# Patient Record
Sex: Female | Born: 1965 | Race: White | Hispanic: Yes | Marital: Married | Smoking: Current every day smoker
Health system: Southern US, Community
[De-identification: ages and names within clinical notes are randomized; demographics above are authoritative.]

## PROBLEM LIST (undated history)

## (undated) DIAGNOSIS — E119 Type 2 diabetes mellitus without complications: Secondary | ICD-10-CM

## (undated) DIAGNOSIS — F32A Depression, unspecified: Secondary | ICD-10-CM

## (undated) DIAGNOSIS — F329 Major depressive disorder, single episode, unspecified: Secondary | ICD-10-CM

## (undated) HISTORY — PX: BREAST SURGERY: SHX581

---

## 2012-06-26 ENCOUNTER — Encounter (HOSPITAL_BASED_OUTPATIENT_CLINIC_OR_DEPARTMENT_OTHER): Payer: Self-pay

## 2012-06-26 ENCOUNTER — Emergency Department (HOSPITAL_BASED_OUTPATIENT_CLINIC_OR_DEPARTMENT_OTHER)
Admission: EM | Admit: 2012-06-26 | Discharge: 2012-06-27 | Disposition: A | Discharge status: Home / Self Care | Payer: 59 | Attending: Emergency Medicine | Admitting: Emergency Medicine

## 2012-06-26 DIAGNOSIS — F17200 Nicotine dependence, unspecified, uncomplicated: Secondary | ICD-10-CM | POA: Insufficient documentation

## 2012-06-26 DIAGNOSIS — R112 Nausea with vomiting, unspecified: Secondary | ICD-10-CM | POA: Insufficient documentation

## 2012-06-26 DIAGNOSIS — R197 Diarrhea, unspecified: Secondary | ICD-10-CM

## 2012-06-26 DIAGNOSIS — Z8659 Personal history of other mental and behavioral disorders: Secondary | ICD-10-CM | POA: Insufficient documentation

## 2012-06-26 DIAGNOSIS — E119 Type 2 diabetes mellitus without complications: Secondary | ICD-10-CM | POA: Insufficient documentation

## 2012-06-26 DIAGNOSIS — M549 Dorsalgia, unspecified: Secondary | ICD-10-CM | POA: Insufficient documentation

## 2012-06-26 DIAGNOSIS — E278 Other specified disorders of adrenal gland: Secondary | ICD-10-CM | POA: Insufficient documentation

## 2012-06-26 DIAGNOSIS — Z79899 Other long term (current) drug therapy: Secondary | ICD-10-CM | POA: Insufficient documentation

## 2012-06-26 HISTORY — DX: Type 2 diabetes mellitus without complications: E11.9

## 2012-06-26 HISTORY — DX: Major depressive disorder, single episode, unspecified: F32.9

## 2012-06-26 HISTORY — DX: Depression, unspecified: F32.A

## 2012-06-26 LAB — URINALYSIS, ROUTINE W REFLEX MICROSCOPIC
Glucose, UA: >1000 mg/dL — AB
Hgb urine dipstick: NEGATIVE
Ketones, ur: 15 mg/dL — AB
Leukocytes, UA: NEGATIVE
Protein, ur: NEGATIVE mg/dL
pH: 6.5 (ref 5.0–8.0)

## 2012-06-26 LAB — PREGNANCY, URINE: Preg Test, Ur: NEGATIVE

## 2012-06-26 NOTE — ED Notes (Signed)
Patient reports that this past Friday developed nausea, vomiting and diarrhea. Has had persistant nausea since Friday and today she now has diffuse abdominal pain with radiation top her back. Vomiting pta

## 2012-06-27 ENCOUNTER — Emergency Department (HOSPITAL_BASED_OUTPATIENT_CLINIC_OR_DEPARTMENT_OTHER): Payer: 59

## 2012-06-27 LAB — CBC WITH DIFFERENTIAL/PLATELET
HCT: 42 % (ref 36.0–46.0)
Hemoglobin: 14.5 g/dL (ref 12.0–15.0)
Lymphs Abs: 2.1 10*3/uL (ref 0.7–4.0)
MCH: 29.6 pg (ref 26.0–34.0)
Monocytes Absolute: 0.5 10*3/uL (ref 0.1–1.0)
Monocytes Relative: 4 % (ref 3–12)
Neutro Abs: 11.7 10*3/uL — ABNORMAL HIGH (ref 1.7–7.7)
Neutrophils Relative %: 81 % — ABNORMAL HIGH (ref 43–77)
RBC: 4.9 MIL/uL (ref 3.87–5.11)

## 2012-06-27 LAB — COMPREHENSIVE METABOLIC PANEL
Alkaline Phosphatase: 75 U/L (ref 39–117)
BUN: 13 mg/dL (ref 6–23)
Chloride: 95 mEq/L — ABNORMAL LOW (ref 96–112)
Creatinine, Ser: 0.6 mg/dL (ref 0.50–1.10)
GFR calc Af Amer: >90 mL/min (ref >90)
GFR calc non Af Amer: >90 mL/min (ref >90)
Glucose, Bld: 339 mg/dL — ABNORMAL HIGH (ref 70–99)
Potassium: 4 mEq/L (ref 3.5–5.1)
Total Bilirubin: 0.2 mg/dL — ABNORMAL LOW (ref 0.3–1.2)

## 2012-06-27 LAB — LIPASE, BLOOD: Lipase: 24 U/L (ref 11–59)

## 2012-06-27 MED ORDER — IOHEXOL 300 MG/ML  SOLN
100.0000 mL | Freq: Once | INTRAMUSCULAR | Status: AC | PRN
  Administered 2012-06-27: 100 mL via INTRAVENOUS

## 2012-06-27 MED ORDER — FENTANYL CITRATE 0.05 MG/ML IJ SOLN
50.0000 ug | Freq: Once | INTRAMUSCULAR | Status: AC
  Administered 2012-06-27: 50 ug via INTRAVENOUS
  Filled 2012-06-27: qty 2

## 2012-06-27 MED ORDER — TRAMADOL HCL 50 MG PO TABS: 50.0000 mg | ORAL_TABLET | Freq: Four times a day (QID) | ORAL | Status: AC | PRN

## 2012-06-27 MED ORDER — IOHEXOL 300 MG/ML  SOLN
50.0000 mL | Freq: Once | INTRAMUSCULAR | Status: AC | PRN
  Administered 2012-06-27: 50 mL via ORAL

## 2012-06-27 MED ORDER — ONDANSETRON 8 MG PO TBDP: ORAL_TABLET | ORAL | Status: AC

## 2012-06-27 MED ORDER — ONDANSETRON HCL 4 MG/2ML IJ SOLN
4.0000 mg | Freq: Once | INTRAMUSCULAR | Status: AC
  Administered 2012-06-27: 4 mg via INTRAVENOUS
  Filled 2012-06-27: qty 2

## 2012-06-27 MED ORDER — SODIUM CHLORIDE 0.9 % IV BOLUS (SEPSIS): 1000.0000 mL | Freq: Once | INTRAVENOUS | Status: DC

## 2012-06-27 NOTE — ED Notes (Signed)
Patient transported to CT 

## 2012-06-27 NOTE — ED Provider Notes (Signed)
History     CSN: [id number]  Arrival date & time 06/26/12  2322   First MD Initiated Contact with Patient 06/27/12 [phone]      Chief Complaint  Patient presents with  . Abdominal Pain    (Consider location/radiation/quality/duration/timing/severity/associated sxs/prior treatment) Patient is a 47 y.o. female presenting with abdominal pain. The history is provided by the patient.  Abdominal Pain The primary symptoms of the illness include abdominal pain, nausea, vomiting and diarrhea. The primary symptoms of the illness do not include shortness of breath or dysuria. The current episode started more than 2 days ago. The onset of the illness was sudden. The problem has not changed since onset. The abdominal pain began more than 2 days ago. The pain came on suddenly. The abdominal pain has been unchanged since its onset. The abdominal pain is generalized. The abdominal pain radiates to the back. The severity of the abdominal pain is 10/10. The abdominal pain is relieved by nothing. Exacerbated by: none.  The vomiting began more than 2 days ago. Vomiting occurs 2 to 5 times per day. The emesis contains stomach contents.  The diarrhea began 3 to 5 days ago. The diarrhea is watery. The diarrhea occurs 2 to 4 times per day.  The patient states that she believes she is currently not pregnant. The patient has had a change in bowel habit. Symptoms associated with the illness do not include anorexia. Significant associated medical issues do not include PUD.    Past Medical History  Diagnosis Date  . Diabetes mellitus without complication   . Depression     History reviewed. No pertinent past surgical history.  No family history on file.  History  Substance Use Topics  . Smoking status: Current Every Day Smoker -- 0.5 packs/day    Types: Cigarettes  . Smokeless tobacco: Not on file  . Alcohol Use: No    OB History    Grav Para Term Preterm Abortions TAB SAB Ect Mult Living                  Review of Systems  Respiratory: Negative for shortness of breath.   Cardiovascular: Negative for chest pain.  Gastrointestinal: Positive for nausea, vomiting, abdominal pain and diarrhea. Negative for anorexia.  Genitourinary: Negative for dysuria.  All other systems reviewed and are negative.    Allergies  Review of patient's allergies indicates no known allergies.  Home Medications   Current Outpatient Rx  Name  Route  Sig  Dispense  Refill  . GLUCOSE BLOOD VI STRP   Other   1 each by Other route as needed. Use as instructed         . METFORMIN HCL 1000 MG PO TABS   Oral   Take 1,000 mg by mouth 2 (two) times daily with a meal.           BP 154/89  Pulse 97  Temp 98.6 F (37 C) (Oral)  Resp 18  SpO2 99%  Physical Exam  Constitutional: She is oriented to person, place, and time. She appears well-developed and well-nourished. No distress.  HENT:  Head: Normocephalic and atraumatic.  Mouth/Throat: Oropharynx is clear and moist.  Eyes: Conjunctivae normal are normal. Pupils are equal, round, and reactive to light.  Neck: Normal range of motion. Neck supple.  Cardiovascular: Normal rate, regular rhythm and intact distal pulses.   Pulmonary/Chest: Effort normal and breath sounds normal. She has no wheezes. She has no rales.  Abdominal: Soft. Bowel sounds  are decreased. There is no tenderness. There is no rebound and negative Murphy's sign.  Musculoskeletal: Normal range of motion.  Neurological: She is alert and oriented to person, place, and time.  Skin: Skin is warm and dry.    ED Course  Procedures (including critical care time)  Labs Reviewed  URINALYSIS, ROUTINE W REFLEX MICROSCOPIC - Abnormal; Notable for the following:    Glucose, UA >1000 (*)     Ketones, ur 15 (*)     All other components within normal limits  CBC WITH DIFFERENTIAL - Abnormal; Notable for the following:    WBC 14.5 (*)     Neutrophils Relative 81 (*)     Neutro Abs 11.7 (*)       All other components within normal limits  COMPREHENSIVE METABOLIC PANEL - Abnormal; Notable for the following:    Sodium 133 (*)     Chloride 95 (*)     Glucose, Bld 339 (*)     Total Bilirubin 0.2 (*)     All other components within normal limits  URINE MICROSCOPIC-ADD ON - Abnormal; Notable for the following:    Squamous Epithelial / LPF FEW (*)     All other components within normal limits  PREGNANCY, URINE  LIPASE, BLOOD   No results found.   No diagnosis found.    MDM  Will need a non emergent outpatient MRi to evaluate adrenal lesion, please ask your regular doctor to schedule this as an outpatient.  Patient verbalizes understanding and agrees to follow up        Kasyn Rolph Smitty Cords, MD 06/27/12 [phone]

## 2012-06-27 NOTE — ED Notes (Signed)
CBG was 258 mg/dcltr.

## 2013-11-15 ENCOUNTER — Encounter (HOSPITAL_BASED_OUTPATIENT_CLINIC_OR_DEPARTMENT_OTHER): Payer: Self-pay | Admitting: Emergency Medicine

## 2013-11-15 ENCOUNTER — Emergency Department (HOSPITAL_BASED_OUTPATIENT_CLINIC_OR_DEPARTMENT_OTHER)
Admission: EM | Admit: 2013-11-15 | Discharge: 2013-11-15 | Disposition: A | Discharge status: Home / Self Care | Payer: 59 | Attending: Emergency Medicine | Admitting: Emergency Medicine

## 2013-11-15 DIAGNOSIS — R110 Nausea: Secondary | ICD-10-CM | POA: Insufficient documentation

## 2013-11-15 DIAGNOSIS — M545 Low back pain, unspecified: Secondary | ICD-10-CM | POA: Insufficient documentation

## 2013-11-15 DIAGNOSIS — Z3202 Encounter for pregnancy test, result negative: Secondary | ICD-10-CM | POA: Insufficient documentation

## 2013-11-15 DIAGNOSIS — Z79899 Other long term (current) drug therapy: Secondary | ICD-10-CM | POA: Insufficient documentation

## 2013-11-15 DIAGNOSIS — F3289 Other specified depressive episodes: Secondary | ICD-10-CM | POA: Insufficient documentation

## 2013-11-15 DIAGNOSIS — R209 Unspecified disturbances of skin sensation: Secondary | ICD-10-CM | POA: Insufficient documentation

## 2013-11-15 DIAGNOSIS — B3731 Acute candidiasis of vulva and vagina: Secondary | ICD-10-CM | POA: Insufficient documentation

## 2013-11-15 DIAGNOSIS — R109 Unspecified abdominal pain: Secondary | ICD-10-CM | POA: Insufficient documentation

## 2013-11-15 DIAGNOSIS — F329 Major depressive disorder, single episode, unspecified: Secondary | ICD-10-CM | POA: Insufficient documentation

## 2013-11-15 DIAGNOSIS — M79609 Pain in unspecified limb: Secondary | ICD-10-CM | POA: Insufficient documentation

## 2013-11-15 DIAGNOSIS — R197 Diarrhea, unspecified: Secondary | ICD-10-CM | POA: Insufficient documentation

## 2013-11-15 DIAGNOSIS — F17200 Nicotine dependence, unspecified, uncomplicated: Secondary | ICD-10-CM | POA: Insufficient documentation

## 2013-11-15 DIAGNOSIS — B373 Candidiasis of vulva and vagina: Secondary | ICD-10-CM | POA: Insufficient documentation

## 2013-11-15 DIAGNOSIS — E119 Type 2 diabetes mellitus without complications: Secondary | ICD-10-CM | POA: Insufficient documentation

## 2013-11-15 DIAGNOSIS — M549 Dorsalgia, unspecified: Secondary | ICD-10-CM

## 2013-11-15 LAB — URINALYSIS, ROUTINE W REFLEX MICROSCOPIC
BILIRUBIN URINE: NEGATIVE
Glucose, UA: NEGATIVE mg/dL
KETONES UR: NEGATIVE mg/dL
NITRITE: NEGATIVE
PROTEIN: NEGATIVE mg/dL
Specific Gravity, Urine: 1.019 (ref 1.005–1.030)
Urobilinogen, UA: 1 mg/dL (ref 0.0–1.0)
pH: 6 (ref 5.0–8.0)

## 2013-11-15 LAB — URINE MICROSCOPIC-ADD ON

## 2013-11-15 LAB — PREGNANCY, URINE: Preg Test, Ur: NEGATIVE

## 2013-11-15 LAB — CBG MONITORING, ED: Glucose-Capillary: 122 mg/dL — ABNORMAL HIGH (ref 70–99)

## 2013-11-15 MED ORDER — HYDROMORPHONE HCL PF 1 MG/ML IJ SOLN
1.0000 mg | Freq: Once | INTRAMUSCULAR | Status: AC
  Administered 2013-11-15: 1 mg via INTRAMUSCULAR
  Filled 2013-11-15: qty 1

## 2013-11-15 MED ORDER — ONDANSETRON 4 MG PO TBDP
4.0000 mg | ORAL_TABLET | Freq: Once | ORAL | Status: AC
  Administered 2013-11-15: 4 mg via ORAL
  Filled 2013-11-15: qty 1

## 2013-11-15 MED ORDER — HYDROCODONE-ACETAMINOPHEN 5-325 MG PO TABS: 1.0000 | ORAL_TABLET | ORAL | Status: AC | PRN

## 2013-11-15 MED ORDER — CYCLOBENZAPRINE HCL 10 MG PO TABS: 10.0000 mg | ORAL_TABLET | Freq: Two times a day (BID) | ORAL | Status: AC | PRN

## 2013-11-15 MED ORDER — NAPROXEN 375 MG PO TABS
375.0000 mg | ORAL_TABLET | Freq: Two times a day (BID) | ORAL | Status: AC
Start: 2013-11-15 — End: ?

## 2013-11-15 NOTE — Discharge Instructions (Signed)
Back Pain, Adult Low back pain is very common. About 1 in 5 people have back pain.The cause of low back pain is rarely dangerous. The pain often gets better over time.About half of people with a sudden onset of back pain feel better in just 2 weeks. About 8 in 10 people feel better by 6 weeks.  CAUSES Some common causes of back pain include:  Strain of the muscles or ligaments supporting the spine.  Wear and tear (degeneration) of the spinal discs.  Arthritis.  Direct injury to the back. DIAGNOSIS Most of the time, the direct cause of low back pain is not known.However, back pain can be treated effectively even when the exact cause of the pain is unknown.Answering your caregiver's questions about your overall health and symptoms is one of the most accurate ways to make sure the cause of your pain is not dangerous. If your caregiver needs more information, he or she may order lab work or imaging tests (X-rays or MRIs).However, even if imaging tests show changes in your back, this usually does not require surgery. HOME CARE INSTRUCTIONS For many people, back pain returns.Since low back pain is rarely dangerous, it is often a condition that people can learn to manageon their own.   Remain active. It is stressful on the back to sit or stand in one place. Do not sit, drive, or stand in one place for more than 30 minutes at a time. Take short walks on level surfaces as soon as pain allows.Try to increase the length of time you walk each day.  Do not stay in bed.Resting more than 1 or 2 days can delay your recovery.  Do not avoid exercise or work.Your body is made to move.It is not dangerous to be active, even though your back may hurt.Your back will likely heal faster if you return to being active before your pain is gone.  Pay attention to your body when you bend and lift. Many people have less discomfortwhen lifting if they bend their knees, keep the load close to their bodies,and  avoid twisting. Often, the most comfortable positions are those that put less stress on your recovering back.  Find a comfortable position to sleep. Use a firm mattress and lie on your side with your knees slightly bent. If you lie on your back, put a pillow under your knees.  Only take over-the-counter or prescription medicines as directed by your caregiver. Over-the-counter medicines to reduce pain and inflammation are often the most helpful.Your caregiver may prescribe muscle relaxant drugs.These medicines help dull your pain so you can more quickly return to your normal activities and healthy exercise.  Put ice on the injured area.  Put ice in a plastic bag.  Place a towel between your skin and the bag.  Leave the ice on for 15-20 minutes, 03-04 times a day for the first 2 to 3 days. After that, ice and heat may be alternated to reduce pain and spasms.  Ask your caregiver about trying back exercises and gentle massage. This may be of some benefit.  Avoid feeling anxious or stressed.Stress increases muscle tension and can worsen back pain.It is important to recognize when you are anxious or stressed and learn ways to manage it.Exercise is a great option. SEEK MEDICAL CARE IF:  You have pain that is not relieved with rest or medicine.  You have pain that does not improve in 1 week.  You have new symptoms.  You are generally not feeling well. SEEK   IMMEDIATE MEDICAL CARE IF:   You have pain that radiates from your back into your legs.  You develop new bowel or bladder control problems.  You have unusual weakness or numbness in your arms or legs.  You develop nausea or vomiting.  You develop abdominal pain.  You feel faint. Document Released: 06/13/2005 Document Revised: 12/13/2011 Document Reviewed: 11/01/2010 [employer] Patient Information 2014 [employer].  

## 2013-11-15 NOTE — ED Notes (Signed)
Lower back pain with radiation into left leg. Chronic pain in her left leg. No recent injury.

## 2013-11-15 NOTE — ED Provider Notes (Signed)
CSN: [id number]     Arrival date & time 11/15/13  2015 History  This chart was scribed for Rolan BuccoMelanie Cranston Koors, MD by Blanchard KelchNicole Curnes, ED Scribe. The patient was seen in room MH02/MH02. Patient's care was started at 8:47 PM.    Chief Complaint  Patient presents with  . Back Pain      Patient is a 48 y.o. female presenting with back pain. The history is provided by the patient. No language interpreter was used.  Back Pain Associated symptoms: abdominal pain and numbness   Associated symptoms: no chest pain, no fever, no headaches and no weakness     HPI Comments: Vanessa Wilkinson is a 48 y.o. female who presents to the Emergency Department complaining of constant, gradually worsening lower back pain that began about a week ago. The pain radiates to her left buttock when she sits. She reports associated intermittent numbness to her left lower extremity that began with the back pain. She denies any current numbness or weakness.  No loss of bowel or bladder control. The pain and numbness is worsened by sitting. She states that she has constant, chronic lower extremity pain that has been worsening recently. She has been seen by her PCP for the back pain and was told that her PCP wanted to start Gabapentin or lyrica, which the patient does not want to take.  She also reports intermittent abdominal pain that began a few months ago. The pain is worsened by eating. She reports associated nausea and loss of appetite. She also reports intermittent diarrhea and urgency to have a BM. She has seen her PCP for this issue and is waiting on blood work results to determine a treatment plan. She states that she has had a yeast infection for a year. She stopped taking her DM medication this week to try to alleviate the yeast infection. She is also being followed by her OB-GYN for her chronic yeast infection.   [facility]    Past Medical History  Diagnosis Date  . Diabetes mellitus without complication    . Depression    Past Surgical History  Procedure Laterality Date  . Cesarean section    . Breast surgery     No family history on file. History  Substance Use Topics  . Smoking status: Current Every Day Smoker -- 0.50 packs/day    Types: Cigarettes  . Smokeless tobacco: Not on file  . Alcohol Use: No   OB History   Grav Para Term Preterm Abortions TAB SAB Ect Mult Living                 Review of Systems  Constitutional: Negative for fever, chills, diaphoresis and fatigue.  HENT: Negative for congestion, rhinorrhea and sneezing.   Eyes: Negative.   Respiratory: Negative for cough, chest tightness and shortness of breath.   Cardiovascular: Negative for chest pain and leg swelling.  Gastrointestinal: Positive for nausea and abdominal pain. Negative for vomiting, diarrhea and blood in stool.  Genitourinary: Negative for frequency, hematuria, flank pain and difficulty urinating.  Musculoskeletal: Positive for arthralgias and back pain. Negative for neck pain.  Skin: Negative for rash and wound.  Neurological: Positive for numbness. Negative for dizziness, speech difficulty, weakness and headaches.      Allergies  Review of patient's allergies indicates no known allergies.  Home Medications   Prior to Admission medications   Medication Sig Start Date End Date Taking? Authorizing Provider  glucose blood test strip 1 each  by Other route as needed. Use as instructed    Historical Provider, MD  metFORMIN (GLUCOPHAGE) 1000 MG tablet Take 1,000 mg by mouth 2 (two) times daily with a meal.    Historical Provider, MD  ondansetron (ZOFRAN ODT) 8 MG disintegrating tablet 8mg  ODT q8 hours prn nausea 06/27/12   April K Palumbo-Rasch, MD  traMADol (ULTRAM) 50 MG tablet Take 1 tablet (50 mg total) by mouth every 6 (six) hours as needed for pain. 06/27/12   April Smitty Cords, MD   Triage Vitals: BP 129/77  Pulse 89  Temp(Src) 98.6 F (37 C) (Oral)  Resp 20  Ht 5\' 3"  (1.6 m)  Wt 170  lb (77.111 kg)  BMI 30.12 kg/m2  SpO2 100%  Physical Exam  Nursing note and vitals reviewed. Constitutional: She is oriented to person, place, and time. She appears well-developed and well-nourished.  HENT:  Head: Normocephalic and atraumatic.  Eyes: Pupils are equal, round, and reactive to light.  Neck: Normal range of motion. Neck supple.  Cardiovascular: Normal rate, regular rhythm and normal heart sounds.   Pedal pulses intact.   Pulmonary/Chest: Effort normal and breath sounds normal. No respiratory distress. She has no wheezes. She has no rales. She exhibits no tenderness.  Abdominal: Soft. Bowel sounds are normal. There is no tenderness. There is no rebound and no guarding.  Musculoskeletal: Normal range of motion. She exhibits tenderness. She exhibits no edema.  Tenderness around the lower lumbar spine and paraspinal area. No step offs or deformities. Positive straight leg raise on the left.  Lymphadenopathy:    She has no cervical adenopathy.  Neurological: She is alert and oriented to person, place, and time.   Normal sensation and motor function in the lower extremities.   Skin: Skin is warm and dry. No rash noted.  Psychiatric: She has a normal mood and affect.    ED Course  Procedures (including critical care time)  DIAGNOSTIC STUDIES: Oxygen Saturation is 100% on room air, normal by my interpretation.    COORDINATION OF CARE: 8:59 PM -Will order UA. Will refer to Sport's Medicine specialist. Return precautions discussed. Patient verbalizes understanding and agrees with treatment plan.    Labs Review Results for orders placed during the hospital encounter of 11/15/13  URINALYSIS, ROUTINE W REFLEX MICROSCOPIC      Result Value Ref Range   Color, Urine YELLOW  YELLOW   APPearance CLOUDY (*) CLEAR   Specific Gravity, Urine 1.019  1.005 - 1.030   pH 6.0  5.0 - 8.0   Glucose, UA NEGATIVE  NEGATIVE mg/dL   Hgb urine dipstick SMALL (*) NEGATIVE   Bilirubin Urine  NEGATIVE  NEGATIVE   Ketones, ur NEGATIVE  NEGATIVE mg/dL   Protein, ur NEGATIVE  NEGATIVE mg/dL   Urobilinogen, UA 1.0  0.0 - 1.0 mg/dL   Nitrite NEGATIVE  NEGATIVE   Leukocytes, UA TRACE (*) NEGATIVE  PREGNANCY, URINE      Result Value Ref Range   Preg Test, Ur NEGATIVE  NEGATIVE  URINE MICROSCOPIC-ADD ON      Result Value Ref Range   Squamous Epithelial / LPF RARE  RARE   WBC, UA 0-2  <3 WBC/hpf   RBC / HPF 0-2  <3 RBC/hpf   Bacteria, UA FEW (*) RARE  CBG MONITORING, ED      Result Value Ref Range   Glucose-Capillary 122 (*) 70 - 99 mg/dL   Comment 1 Notify RN     Comment 2 Documented in Chart  No results found.   Imaging Review No results found.   EKG Interpretation None      MDM   Final diagnoses:  Back pain    Patient presents with radicular back pain. She has no neurologic deficits or evidence of cauda equina. She has no recent injuries that warrant imaging studies. She was given a prescription for Vicodin and Flexeril as well as percent used for the discomfort. She was given a referral to follow with Dr. Pearletha Forge. She also presents with abdominal pain that's been going on for the last 2-3 months. She just saw her primary care physician for this today and had blood work drawn. She doesn't know what the blood work was and did not want this further evaluated in the ED as she has just had an evaluation. I did check a urine which did not show evidence of a urinary tract infection. A culture was sent for analysis.  I personally performed the services described in this documentation, which was scribed in my presence.  The recorded information has been reviewed and considered.    Rolan Bucco, MD 11/15/13 2155

## 2013-11-17 LAB — URINE CULTURE

## 2013-11-19 ENCOUNTER — Telehealth (HOSPITAL_BASED_OUTPATIENT_CLINIC_OR_DEPARTMENT_OTHER): Payer: Self-pay | Admitting: Emergency Medicine

## 2013-11-19 NOTE — Telephone Encounter (Signed)
Post ED Visit - Positive Culture Follow-up  Culture report reviewed by antimicrobial stewardship pharmacist: []  Wes Dulaney, Pharm.D., BCPS [x]  Celedonio Miyamoto, Pharm.D., BCPS []  Georgina Pillion, Pharm.D., BCPS []  [place], [address].D., BCPS, AAHIVP []  Estella Husk, Pharm.D., BCPS, AAHIVP []  Harvie Junior, Pharm.D.  Positive urine culture Per Santiago Glad PA-C, no treatment needed and no further patient follow-up is required at this time.  Satchel Heidinger 11/19/2013, 1:05 PM

## 2013-12-23 IMAGING — CT CT ABD-PELV W/ CM
2 of 5 series · 16 of 46 positions shown, 18 images · IV contrast (omnipaque)
Comparison: None.

CLINICAL DATA: Abdominal pain, nausea, vomiting, diarrhea and
constipation, per patient.

CT ABDOMEN AND PELVIS WITH CONTRAST
TECHNIQUE: Multidetector CT imaging of the abdomen and pelvis was
performed following the standard protocol during bolus
administration of intravenous contrast.
Contrast: 100 mL of Omnipaque 300 IV contrast

[Series 2: abd/pelvis 5.0 b31f · axial · 0.80mm/px · z∈[+789,+1229]mm · 13 of 100 slices shown, 15 images]
[im 6/100  soft-tissue]
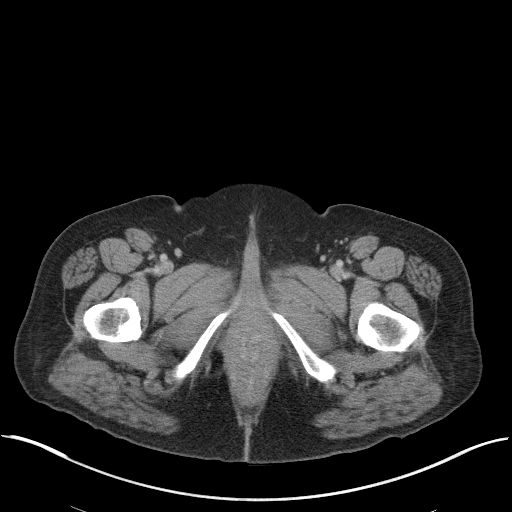
[im 6/100  bone]
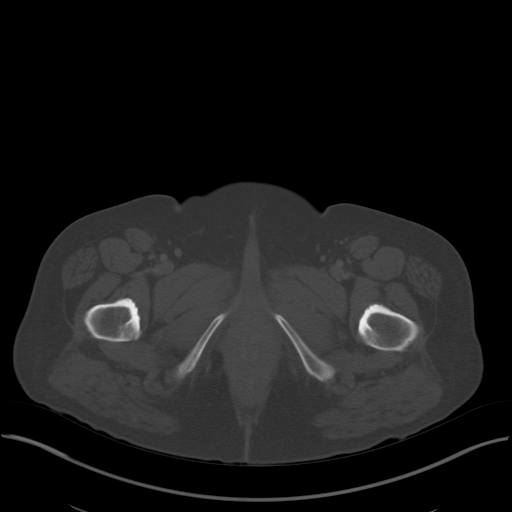
[im 12/100  soft-tissue]
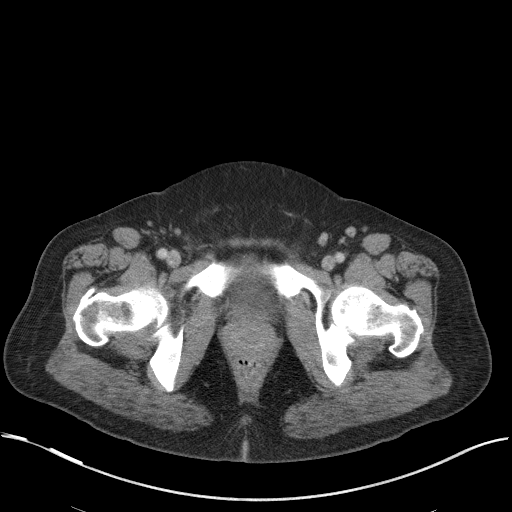
[im 23/100  soft-tissue]
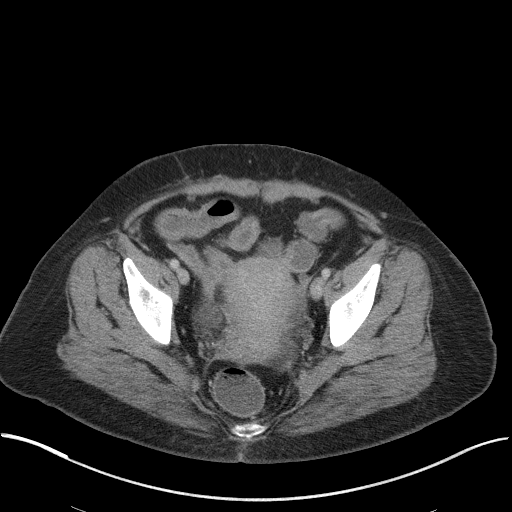
[im 28/100  soft-tissue]
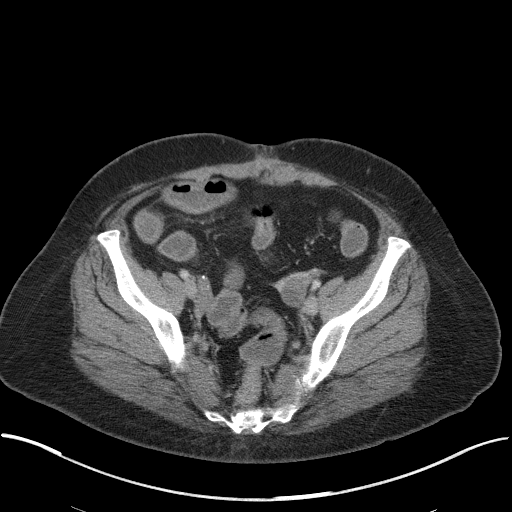
[im 34/100  soft-tissue]
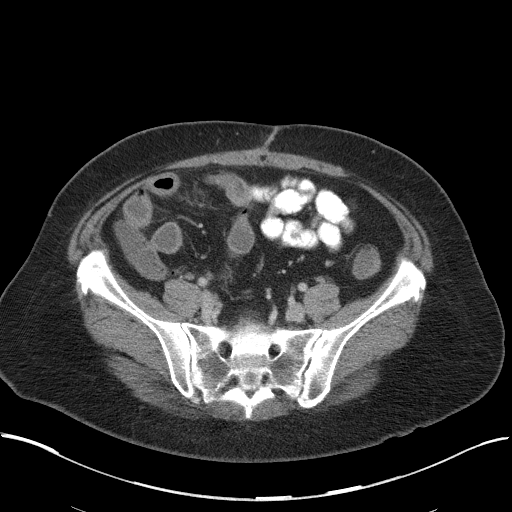
[im 45/100  soft-tissue]
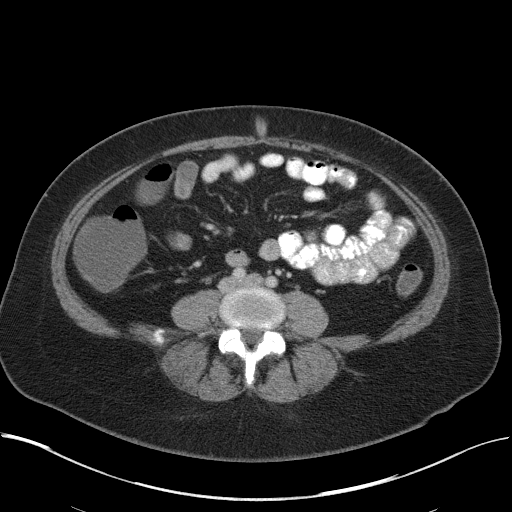
[im 50/100  soft-tissue]
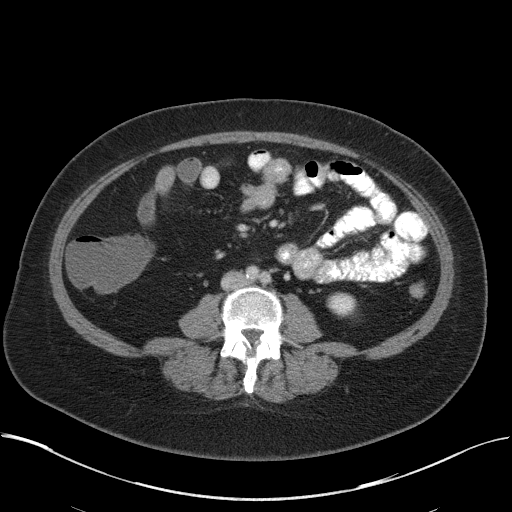
[im 56/100  soft-tissue]
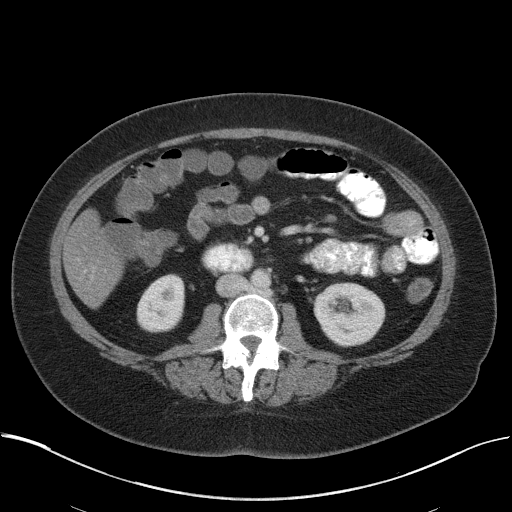
[im 67/100  soft-tissue]
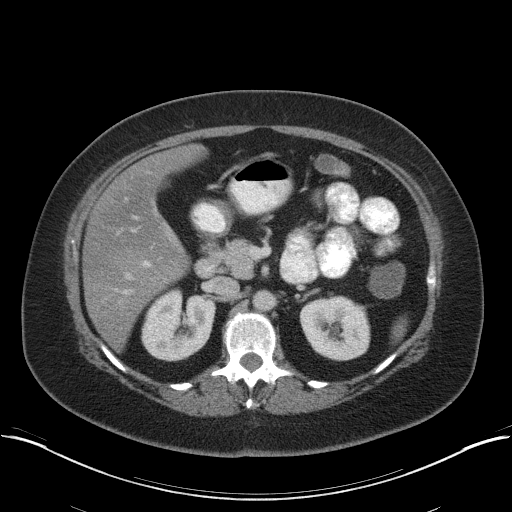
[im 67/100  bone]
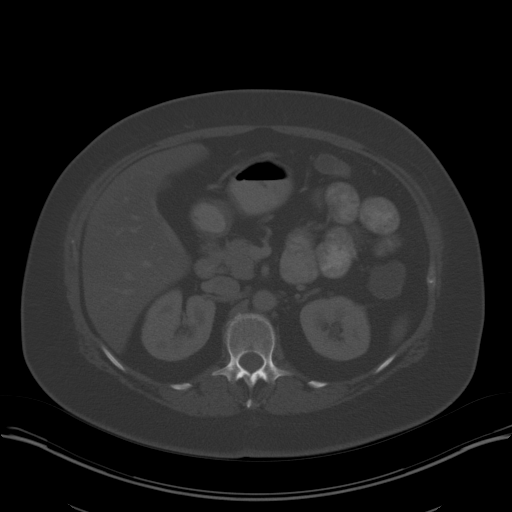
[im 72/100  soft-tissue]
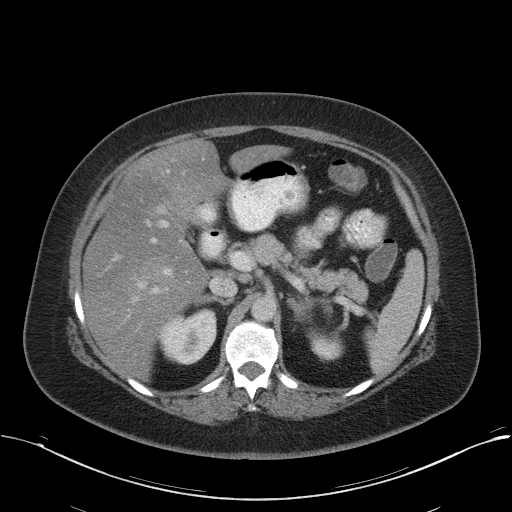
[im 78/100  soft-tissue]
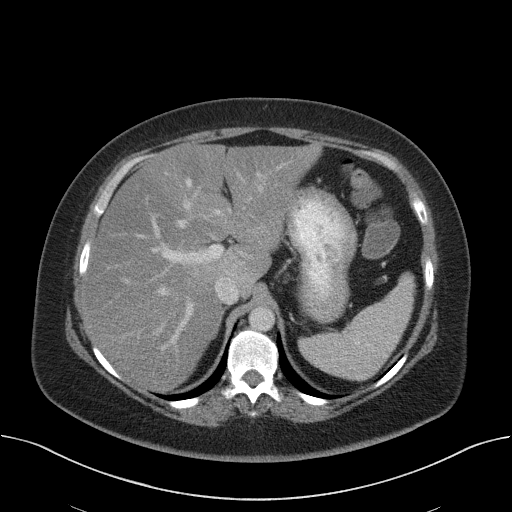
[im 89/100  soft-tissue]
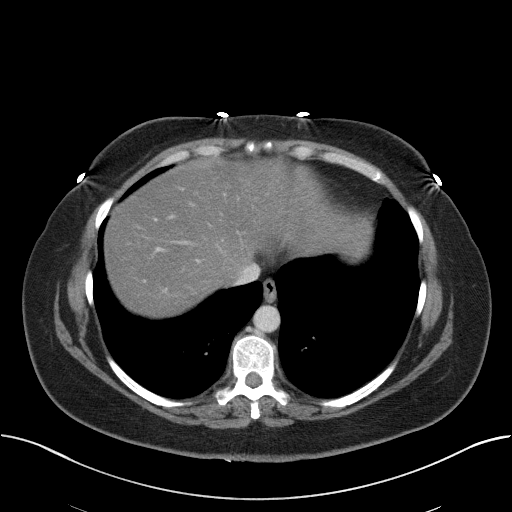
[im 94/100  soft-tissue]
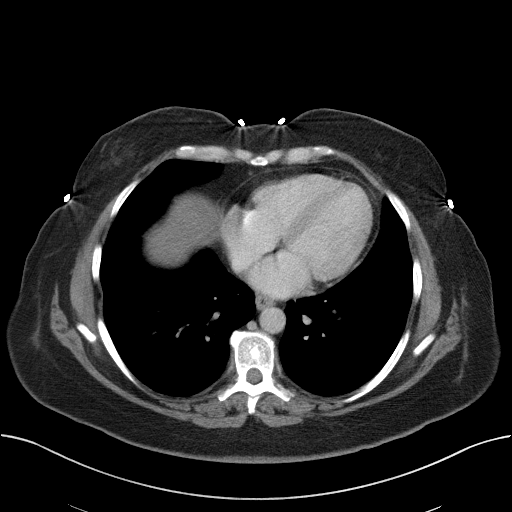

[Series 5: abd/pelvis 3.0 coronal · coronal · 0.92mm/px · 3 of 96 slices shown]
[im 32/96  soft-tissue]
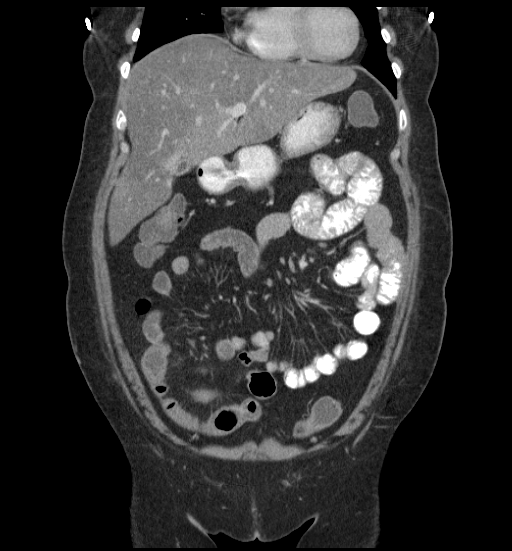
[im 43/96  soft-tissue]
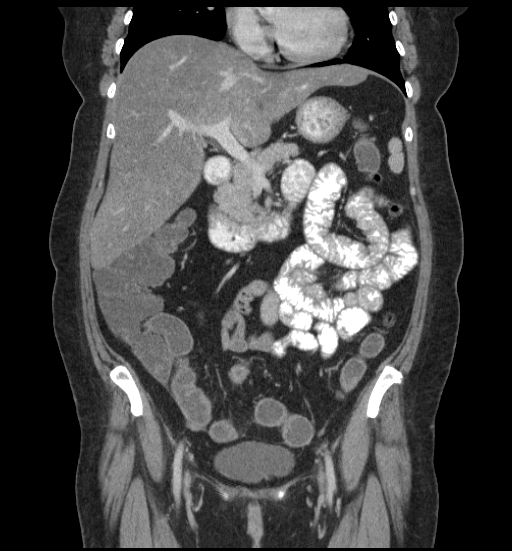
[im 53/96  soft-tissue]
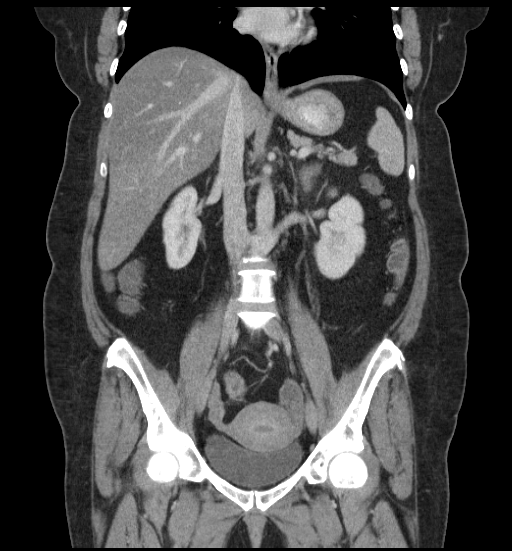

[16 of 46 positions shown; findings below may reference images not displayed]

FINDINGS: The visualized lung bases are clear.

There is diffuse fatty infiltration within the liver, with areas of
focal sparing.  The liver is otherwise unremarkable in appearance.
No dominant mass is seen.  The spleen is unremarkable in
appearance.  The gallbladder is relatively contracted and grossly
unremarkable.  The pancreas and right adrenal gland are
unremarkable.  A 2.0 cm left adrenal lesion is of slightly too high
attenuation to classify as an adrenal adenoma on this study.

The kidneys are unremarkable in appearance.  There is no evidence
of hydronephrosis.  No renal or ureteral stones are seen.  No
perinephric stranding is appreciated.

No free fluid is identified.  The small bowel is unremarkable in
appearance.  The stomach is within normal limits.  No acute
vascular abnormalities are seen.  Minimal scattered calcification
is noted along the abdominal aorta.  A retroaortic left renal vein
is incidentally seen.

The appendix is normal in caliber and contains air, without
evidence for appendicitis.  The colon is grossly unremarkable in
appearance.

The bladder is mildly distended and grossly unremarkable.  The
uterus is within normal limits.  The ovaries are grossly symmetric;
no suspicious adnexal masses are seen.  No inguinal lymphadenopathy
is seen.

No acute osseous abnormalities are identified.
IMPRESSION: 1.  No acute abnormality seen within the abdomen or pelvis.
2.  Diffuse fatty infiltration of the liver, with areas of focal
sparing.
3.  2.0 cm left adrenal lesion noted, of too high attenuation to
classify as an adrenal adenoma on this study.  Adrenal protocol MRI
or CT would be helpful for further evaluation, on an elective non-
emergent basis.

4.  Minimal scattered calcification along the abdominal aorta.  A

## 2024-01-09 ENCOUNTER — Other Ambulatory Visit: Payer: Self-pay | Admitting: Medical Genetics

## 2024-04-29 ENCOUNTER — Other Ambulatory Visit: Payer: Self-pay | Admitting: Medical Genetics

## 2024-04-29 DIAGNOSIS — Z006 Encounter for examination for normal comparison and control in clinical research program: Secondary | ICD-10-CM
